# Patient Record
Sex: Male | Born: 1985
Health system: Southern US, Community
[De-identification: ages and names within clinical notes are randomized; demographics above are authoritative.]

---

## 2016-05-02 DIAGNOSIS — F900 Attention-deficit hyperactivity disorder, predominantly inattentive type: Secondary | ICD-10-CM | POA: Diagnosis not present

## 2016-10-24 DIAGNOSIS — F900 Attention-deficit hyperactivity disorder, predominantly inattentive type: Secondary | ICD-10-CM | POA: Diagnosis not present

## 2017-01-21 DIAGNOSIS — Z13220 Encounter for screening for lipoid disorders: Secondary | ICD-10-CM | POA: Diagnosis not present

## 2017-01-21 DIAGNOSIS — Z0000 Encounter for general adult medical examination without abnormal findings: Secondary | ICD-10-CM | POA: Diagnosis not present

## 2017-01-23 DIAGNOSIS — Z6838 Body mass index (BMI) 38.0-38.9, adult: Secondary | ICD-10-CM | POA: Diagnosis not present

## 2017-01-23 DIAGNOSIS — Z0000 Encounter for general adult medical examination without abnormal findings: Secondary | ICD-10-CM | POA: Diagnosis not present

## 2017-04-17 DIAGNOSIS — F900 Attention-deficit hyperactivity disorder, predominantly inattentive type: Secondary | ICD-10-CM | POA: Diagnosis not present

## 2017-09-02 DIAGNOSIS — R030 Elevated blood-pressure reading, without diagnosis of hypertension: Secondary | ICD-10-CM | POA: Diagnosis not present

## 2017-09-02 DIAGNOSIS — E663 Overweight: Secondary | ICD-10-CM | POA: Diagnosis not present

## 2017-09-02 DIAGNOSIS — Z6836 Body mass index (BMI) 36.0-36.9, adult: Secondary | ICD-10-CM | POA: Diagnosis not present

## 2017-09-02 DIAGNOSIS — J45909 Unspecified asthma, uncomplicated: Secondary | ICD-10-CM | POA: Diagnosis not present

## 2017-10-09 DIAGNOSIS — F900 Attention-deficit hyperactivity disorder, predominantly inattentive type: Secondary | ICD-10-CM | POA: Diagnosis not present

## 2018-04-02 DIAGNOSIS — F900 Attention-deficit hyperactivity disorder, predominantly inattentive type: Secondary | ICD-10-CM | POA: Diagnosis not present

## 2018-09-24 DIAGNOSIS — F900 Attention-deficit hyperactivity disorder, predominantly inattentive type: Secondary | ICD-10-CM | POA: Diagnosis not present

## 2019-08-22 ENCOUNTER — Emergency Department (HOSPITAL_COMMUNITY)
Admission: EM | Admit: 2019-08-22 | Discharge: 2019-08-22 | Disposition: A | Discharge status: Home / Self Care | Payer: BC Managed Care – PPO | Attending: Emergency Medicine | Admitting: Emergency Medicine

## 2019-08-22 ENCOUNTER — Emergency Department (HOSPITAL_COMMUNITY): Payer: BC Managed Care – PPO

## 2019-08-22 ENCOUNTER — Encounter (HOSPITAL_COMMUNITY): Payer: Self-pay | Admitting: *Deleted

## 2019-08-22 ENCOUNTER — Other Ambulatory Visit: Payer: Self-pay

## 2019-08-22 ENCOUNTER — Other Ambulatory Visit (HOSPITAL_COMMUNITY): Payer: BC Managed Care – PPO

## 2019-08-22 DIAGNOSIS — R1011 Right upper quadrant pain: Secondary | ICD-10-CM | POA: Diagnosis not present

## 2019-08-22 DIAGNOSIS — R1013 Epigastric pain: Secondary | ICD-10-CM | POA: Diagnosis not present

## 2019-08-22 DIAGNOSIS — Q600 Renal agenesis, unilateral: Secondary | ICD-10-CM | POA: Diagnosis not present

## 2019-08-22 DIAGNOSIS — R109 Unspecified abdominal pain: Secondary | ICD-10-CM

## 2019-08-22 LAB — COMPREHENSIVE METABOLIC PANEL
ALT: 33 U/L (ref 0–44)
AST: 24 U/L (ref 15–41)
Albumin: 4.5 g/dL (ref 3.5–5.0)
Alkaline Phosphatase: 85 U/L (ref 38–126)
Anion gap: 13 (ref 5–15)
BUN: 10 mg/dL (ref 6–20)
CO2: 22 mmol/L (ref 22–32)
Calcium: 9.8 mg/dL (ref 8.9–10.3)
Chloride: 104 mmol/L (ref 98–111)
Creatinine, Ser: 0.94 mg/dL (ref 0.61–1.24)
GFR calc Af Amer: >60 mL/min (ref >60)
GFR calc non Af Amer: >60 mL/min (ref >60)
Glucose, Bld: 126 mg/dL — ABNORMAL HIGH (ref 70–99)
Potassium: 4 mmol/L (ref 3.5–5.1)
Sodium: 139 mmol/L (ref 135–145)
Total Bilirubin: 0.6 mg/dL (ref 0.3–1.2)
Total Protein: 8 g/dL (ref 6.5–8.1)

## 2019-08-22 LAB — CBC
HCT: 52.8 % — ABNORMAL HIGH (ref 39.0–52.0)
Hemoglobin: 17.6 g/dL — ABNORMAL HIGH (ref 13.0–17.0)
MCH: 29.9 pg (ref 26.0–34.0)
MCHC: 33.3 g/dL (ref 30.0–36.0)
MCV: 89.8 fL (ref 80.0–100.0)
Platelets: 271 10*3/uL (ref 150–400)
RBC: 5.88 MIL/uL — ABNORMAL HIGH (ref 4.22–5.81)
RDW: 12.2 % (ref 11.5–15.5)
WBC: 17.8 10*3/uL — ABNORMAL HIGH (ref 4.0–10.5)
nRBC: 0 % (ref 0.0–0.2)

## 2019-08-22 LAB — URINALYSIS, ROUTINE W REFLEX MICROSCOPIC
Bilirubin Urine: NEGATIVE
Glucose, UA: NEGATIVE mg/dL
Hgb urine dipstick: NEGATIVE
Ketones, ur: 5 mg/dL — AB
Leukocytes,Ua: NEGATIVE
Nitrite: NEGATIVE
Protein, ur: NEGATIVE mg/dL
Specific Gravity, Urine: 1.038 — ABNORMAL HIGH (ref 1.005–1.030)
pH: 6 (ref 5.0–8.0)

## 2019-08-22 LAB — LIPASE, BLOOD: Lipase: 22 U/L (ref 11–51)

## 2019-08-22 LAB — TROPONIN I (HIGH SENSITIVITY): Troponin I (High Sensitivity): 3 ng/L (ref <18)

## 2019-08-22 MED ORDER — IOHEXOL 300 MG/ML  SOLN
100.0000 mL | Freq: Once | INTRAMUSCULAR | Status: AC | PRN
  Administered 2019-08-22: 100 mL via INTRAVENOUS

## 2019-08-22 MED ORDER — DICYCLOMINE HCL 10 MG PO CAPS
10.0000 mg | ORAL_CAPSULE | Freq: Once | ORAL | Status: AC
  Administered 2019-08-22: 10 mg via ORAL
  Filled 2019-08-22: qty 1

## 2019-08-22 MED ORDER — HYDROMORPHONE HCL 1 MG/ML IJ SOLN
1.0000 mg | Freq: Once | INTRAMUSCULAR | Status: AC
  Administered 2019-08-22: 1 mg via INTRAVENOUS
  Filled 2019-08-22: qty 1

## 2019-08-22 MED ORDER — LACTATED RINGERS IV BOLUS
1000.0000 mL | Freq: Once | INTRAVENOUS | Status: AC
  Administered 2019-08-22: 1000 mL via INTRAVENOUS

## 2019-08-22 MED ORDER — OMEPRAZOLE 20 MG PO CPDR: 20.0000 mg | DELAYED_RELEASE_CAPSULE | Freq: Every day | ORAL | 0 refills | Status: AC

## 2019-08-22 MED ORDER — SUCRALFATE 1 G PO TABS: 1.0000 g | ORAL_TABLET | Freq: Three times a day (TID) | ORAL | 0 refills | Status: AC

## 2019-08-22 MED ORDER — FENTANYL CITRATE (PF) 100 MCG/2ML IJ SOLN
50.0000 ug | Freq: Once | INTRAMUSCULAR | Status: AC
  Administered 2019-08-22: 50 ug via INTRAVENOUS
  Filled 2019-08-22 (×2): qty 2

## 2019-08-22 MED ORDER — ALUM & MAG HYDROXIDE-SIMETH 200-200-20 MG/5ML PO SUSP
30.0000 mL | Freq: Once | ORAL | Status: AC
  Administered 2019-08-22: 10:00:00 30 mL via ORAL
  Filled 2019-08-22: qty 30

## 2019-08-22 MED ORDER — LACTATED RINGERS IV BOLUS
1000.0000 mL | Freq: Once | INTRAVENOUS | Status: AC
  Administered 2019-08-22: 06:00:00 1000 mL via INTRAVENOUS

## 2019-08-22 MED ORDER — ONDANSETRON HCL 4 MG/2ML IJ SOLN
4.0000 mg | Freq: Once | INTRAMUSCULAR | Status: AC
  Administered 2019-08-22: 4 mg via INTRAVENOUS
  Filled 2019-08-22: qty 2

## 2019-08-22 MED ORDER — SODIUM CHLORIDE 0.9% FLUSH
3.0000 mL | Freq: Once | INTRAVENOUS | Status: AC
  Administered 2019-08-22: 3 mL via INTRAVENOUS

## 2019-08-22 MED ORDER — ONDANSETRON 4 MG PO TBDP
8.0000 mg | ORAL_TABLET | Freq: Once | ORAL | Status: AC
  Administered 2019-08-22: 8 mg via ORAL
  Filled 2019-08-22: qty 2

## 2019-08-22 NOTE — ED Notes (Signed)
Patient verbalizes understanding of discharge instructions. Opportunity for questioning and answers were provided. Armband removed by staff, pt discharged from ED via wheelchair to home.  

## 2019-08-22 NOTE — ED Triage Notes (Signed)
The pt has had epigastric pain radiating  Around rt laterally  For 12 hours  He had a piece of pizza around 1545 and the pain became worse  Then about 2 hours he was feeling better and he ate peanuts and drank orange juice  Which made the pain much worse vomiting on arrival sweating

## 2019-08-22 NOTE — ED Provider Notes (Signed)
Emergency Department Provider Note   I have reviewed the triage vital signs and the nursing notes.   HISTORY  Chief Complaint Abdominal Pain   HPI Rodney Macias is a 33 y.o. male who presents the emergency department today for decreased bowel movements and abdominal pain.  Patient states that last couple days a progressively worsening epigastric and right upper quadrant abdominal pain.  Decreased flatus as well.  Patient states that most recently got worse after eating pizza.  Did subside a little bit after a few hours but then he had another fatty snack and it came back.  He had some vomiting with it and dry heaving here.  No fevers.  No history of the same.  No history of abdominal surgeries.  No known cancers.  Patient was apparently diet reticulocyte in triage.   No other associated or modifying symptoms.    History reviewed. No pertinent past medical history.  There are no active problems to display for this patient.   History reviewed. No pertinent surgical history.  Current Outpatient Rx  . Order #: [id number] Class: Historical Med  . Order #: [id number] Class: Historical Med  . Order #: [id number] Class: Historical Med  . Order #: [id number] Class: Historical Med    Allergies Patient has no known allergies.  No family history on file.  Social History Social History   Tobacco Use  . Smoking status: Never Smoker  . Smokeless tobacco: Never Used  Substance Use Topics  . Alcohol use: Never    Frequency: Never  . Drug use: Never    Review of Systems  All other systems negative except as documented in the HPI. All pertinent positives and negatives as reviewed in the HPI. ____________________________________________   PHYSICAL EXAM:  VITAL SIGNS: ED Triage Vitals  Enc Vitals Group     BP 08/22/19 0434 (!) 165/112     Pulse Rate 08/22/19 0434 (!) 104     Resp 08/22/19 0434 20     Temp 08/22/19 0434 97.8 F (36.6 C)     Temp Source 08/22/19 0434 Oral   SpO2 08/22/19 0434 100 %     Weight 08/22/19 0522 240 lb (108.9 kg)     Height 08/22/19 0522 5\' 10"  (1.778 m)     Head Circumference --      Peak Flow --      Pain Score 08/22/19 0521 10     Pain Loc --      Pain Edu? --      Excl. in [place]? --     Constitutional: Alert and oriented. Well appearing but did have a couple episodes of dry heaving while I was in the room. Eyes: Conjunctivae are normal. PERRL. EOMI. Head: Atraumatic. Nose: No congestion/rhinnorhea. Mouth/Throat: Mucous membranes are dry.  Oropharynx non-erythematous. Neck: No stridor.  No meningeal signs.   Cardiovascular: Normal rate, regular rhythm. Good peripheral circulation. Grossly normal heart sounds.   Respiratory: Normal respiratory effort.  No retractions. Lungs CTAB. Gastrointestinal: Soft and tender in his right upper quadrant more so than epigastric and right lower quadrant.  Left side is normal.  Patient feels just tended and bloated does not have tense ascites or distention on my exam but hard to assess further secondary to body habitus.  Musculoskeletal: No lower extremity tenderness nor edema. No gross deformities of extremities. Neurologic:  Normal speech and language. No gross focal neurologic deficits are appreciated.  Skin:  Skin is warm, dry and intact. No rash noted.  ____________________________________________   LABS (all  labs ordered are listed, but only abnormal results are displayed)  Labs Reviewed  COMPREHENSIVE METABOLIC PANEL - Abnormal; Notable for the following components:      Result Value   Glucose, Bld 126 (*)    All other components within normal limits  CBC - Abnormal; Notable for the following components:   WBC 17.8 (*)    RBC 5.88 (*)    Hemoglobin 17.6 (*)    HCT 52.8 (*)    All other components within normal limits  LIPASE, BLOOD  URINALYSIS, ROUTINE W REFLEX MICROSCOPIC  TROPONIN I (HIGH SENSITIVITY)  TROPONIN I (HIGH SENSITIVITY)    ____________________________________________  EKG   EKG Interpretation  Date/Time:  Saturday August 22 2019 04:38:27 EDT Ventricular Rate:  94 PR Interval:  142 QRS Duration: 82 QT Interval:  336 QTC Calculation: 420 R Axis:   36 Text Interpretation:  Normal sinus rhythm with sinus arrhythmia Minimal voltage criteria for LVH, may be normal variant Borderline ECG No old tracing to compare Confirmed by Marily MemosMesner, Avinash Maltos [id number]) on 08/22/2019 6:29:04 AM       ____________________________________________  RADIOLOGY  No results found.  ____________________________________________   PROCEDURES  Procedure(s) performed:   Procedures   ____________________________________________   INITIAL IMPRESSION / ASSESSMENT AND PLAN / ED COURSE  Concern for obstruction versus cholecystitis versus pancreatitis versus possibly but less likely dissection.  Will evaluate with CT scan and labs.  Medications ordered for symptoms at this time.  Care transferred pending ct scan and reval.      Pertinent labs & imaging results that were available during my care of the patient were reviewed by me and considered in my medical decision making (see chart for details). ____________________________________________  FINAL CLINICAL IMPRESSION(S) / ED DIAGNOSES  Final diagnoses:  None     MEDICATIONS GIVEN DURING THIS VISIT:  Medications  fentaNYL (SUBLIMAZE) injection 50 mcg (50 mcg Intravenous Not Given 08/22/19 0549)  lactated ringers bolus 1,000 mL (has no administration in time range)  sodium chloride flush (NS) 0.9 % injection 3 mL (3 mLs Intravenous Given 08/22/19 0543)  ondansetron (ZOFRAN-ODT) disintegrating tablet 8 mg (8 mg Oral Given 08/22/19 0448)  lactated ringers bolus 1,000 mL (1,000 mLs Intravenous New Bag/Given 08/22/19 0609)  HYDROmorphone (DILAUDID) injection 1 mg (1 mg Intravenous Given 08/22/19 0556)  ondansetron (ZOFRAN) injection 4 mg (4 mg Intravenous Given 08/22/19 0554)      NEW OUTPATIENT MEDICATIONS STARTED DURING THIS VISIT:  New Prescriptions   No medications on file    Note:  This note was prepared with assistance of Dragon voice recognition software. Occasional wrong-word or sound-a-like substitutions may have occurred due to the inherent limitations of voice recognition software.   Tashan Kreitzer, Barbara CowerJason, MD 08/23/19 [phone]

## 2019-08-22 NOTE — ED Notes (Signed)
Patient transported to Ultrasound 

## 2019-08-22 NOTE — ED Provider Notes (Signed)
I received this patient in signout from Dr. Dayna Barker.  Briefly, he had presented with epigastric abdominal pain that had worsened after eating pizza.  At time of signout, his lab work was notable only for WBC 17, normal LFTs.  Awaiting CT scan.  CT shows no acute findings to explain the patient's symptoms.  I did obtain a right upper quadrant ultrasound which showed a normal gallbladder.  Patient comfortable on reassessment. I discussed starting PPI and carafate and following low acid diet. Instructed to f/u with PCP.  I have extensively reviewed return precautions with him and he voiced understanding.   Raeshawn Vo, Wenda Overland, MD 08/22/19 1159

## 2019-08-22 NOTE — ED Notes (Signed)
Pt returns from [facility]. 

## 2019-08-22 NOTE — ED Notes (Signed)
NO BM FOR 2 DAYS

## 2019-08-22 NOTE — ED Notes (Signed)
Pt requesting additional pain meds.

## 2019-08-24 DIAGNOSIS — R1013 Epigastric pain: Secondary | ICD-10-CM | POA: Diagnosis not present

## 2019-08-27 DIAGNOSIS — Z1159 Encounter for screening for other viral diseases: Secondary | ICD-10-CM | POA: Diagnosis not present

## 2019-09-01 DIAGNOSIS — R112 Nausea with vomiting, unspecified: Secondary | ICD-10-CM | POA: Diagnosis not present

## 2019-09-01 DIAGNOSIS — K2930 Chronic superficial gastritis without bleeding: Secondary | ICD-10-CM | POA: Diagnosis not present

## 2019-09-01 DIAGNOSIS — R1013 Epigastric pain: Secondary | ICD-10-CM | POA: Diagnosis not present

## 2019-10-09 DIAGNOSIS — F900 Attention-deficit hyperactivity disorder, predominantly inattentive type: Secondary | ICD-10-CM | POA: Diagnosis not present

## 2019-11-27 DIAGNOSIS — Z021 Encounter for pre-employment examination: Secondary | ICD-10-CM | POA: Diagnosis not present

## 2020-04-01 DIAGNOSIS — F900 Attention-deficit hyperactivity disorder, predominantly inattentive type: Secondary | ICD-10-CM | POA: Diagnosis not present

## 2020-08-08 ENCOUNTER — Emergency Department (HOSPITAL_COMMUNITY): Payer: BC Managed Care – PPO

## 2020-08-08 ENCOUNTER — Other Ambulatory Visit: Payer: Self-pay

## 2020-08-08 ENCOUNTER — Emergency Department (HOSPITAL_COMMUNITY)
Admission: EM | Admit: 2020-08-08 | Discharge: 2020-08-08 | Disposition: A | Discharge status: Home / Self Care | Payer: BC Managed Care – PPO | Attending: Emergency Medicine | Admitting: Emergency Medicine

## 2020-08-08 ENCOUNTER — Encounter (HOSPITAL_COMMUNITY): Payer: Self-pay

## 2020-08-08 DIAGNOSIS — R059 Cough, unspecified: Secondary | ICD-10-CM

## 2020-08-08 DIAGNOSIS — U071 COVID-19: Secondary | ICD-10-CM | POA: Diagnosis not present

## 2020-08-08 DIAGNOSIS — R000 Tachycardia, unspecified: Secondary | ICD-10-CM | POA: Diagnosis not present

## 2020-08-08 DIAGNOSIS — R05 Cough: Secondary | ICD-10-CM | POA: Diagnosis not present

## 2020-08-08 DIAGNOSIS — Z79899 Other long term (current) drug therapy: Secondary | ICD-10-CM | POA: Diagnosis not present

## 2020-08-08 LAB — SARS CORONAVIRUS 2 BY RT PCR (HOSPITAL ORDER, PERFORMED IN ~~LOC~~ HOSPITAL LAB): SARS Coronavirus 2: POSITIVE — AB

## 2020-08-08 MED ORDER — METHYLPREDNISOLONE SODIUM SUCC 125 MG IJ SOLR: 125.0000 mg | Freq: Once | INTRAMUSCULAR | Status: DC | PRN

## 2020-08-08 MED ORDER — SODIUM CHLORIDE 0.9 % IV SOLN: INTRAVENOUS | Status: DC | PRN

## 2020-08-08 MED ORDER — DIPHENHYDRAMINE HCL 50 MG/ML IJ SOLN: 50.0000 mg | Freq: Once | INTRAMUSCULAR | Status: DC | PRN

## 2020-08-08 MED ORDER — ACETAMINOPHEN 500 MG PO TABS: 1000.0000 mg | ORAL_TABLET | Freq: Once | ORAL | Status: DC

## 2020-08-08 MED ORDER — SODIUM CHLORIDE 0.9 % IV SOLN
1200.0000 mg | Freq: Once | INTRAVENOUS | Status: AC
  Administered 2020-08-08: 1200 mg via INTRAVENOUS
  Filled 2020-08-08: qty 10

## 2020-08-08 MED ORDER — FAMOTIDINE IN NACL 20-0.9 MG/50ML-% IV SOLN: 20.0000 mg | Freq: Once | INTRAVENOUS | Status: DC | PRN

## 2020-08-08 MED ORDER — ALBUTEROL SULFATE HFA 108 (90 BASE) MCG/ACT IN AERS: 2.0000 | INHALATION_SPRAY | Freq: Once | RESPIRATORY_TRACT | Status: DC | PRN

## 2020-08-08 MED ORDER — ACETAMINOPHEN 325 MG PO TABS
325.0000 mg | ORAL_TABLET | Freq: Once | ORAL | Status: AC
  Administered 2020-08-08: 325 mg via ORAL
  Filled 2020-08-08: qty 1

## 2020-08-08 MED ORDER — EPINEPHRINE 0.3 MG/0.3ML IJ SOAJ: 0.3000 mg | Freq: Once | INTRAMUSCULAR | Status: DC | PRN

## 2020-08-08 MED ORDER — ACETAMINOPHEN 325 MG PO TABS
650.0000 mg | ORAL_TABLET | Freq: Once | ORAL | Status: AC | PRN
  Administered 2020-08-08: 650 mg via ORAL
  Filled 2020-08-08: qty 2

## 2020-08-08 NOTE — ED Provider Notes (Signed)
[facility] DEPT Provider Note   CSN: [id number] Arrival date & time: 08/08/20  1224     History Chief Complaint  Patient presents with  . Shortness of Breath  . Cough  . Fever    Rodney Macias is a 34 y.o. male.  Patient is a 34 year old male with no significant past medical history.  He presents with complaints of fever, cough, and shortness of breath.  This has worsened over the past 3 days.  Patient reports being had a kit together with other individuals who have since been diagnosed with COVID-19.  He denies any aggravating or alleviating factors.  Patient has not received either dose of Covid vaccine.  The history is provided by the patient.       History reviewed. No pertinent past medical history.  There are no problems to display for this patient.   History reviewed. No pertinent surgical history.     History reviewed. No pertinent family history.  Social History   Tobacco Use  . Smoking status: Never Smoker  . Smokeless tobacco: Never Used  Substance Use Topics  . Alcohol use: Never  . Drug use: Never    Home Medications Prior to Admission medications   Medication Sig Start Date End Date Taking? Authorizing Provider  amphetamine-dextroamphetamine (ADDERALL XR) 30 MG 24 hr capsule Take 30 mg by mouth daily as needed (when working).   Yes [provider]  amphetamine-dextroamphetamine (ADDERALL) 20 MG tablet Take 20 mg by mouth daily as needed (when working).   Yes [provider]  omeprazole (PRILOSEC) 20 MG capsule Take 1 capsule (20 mg total) by mouth daily. Patient not taking: Reported on 08/08/2020 08/22/19   Little, Wenda Overland, MD  sucralfate (CARAFATE) 1 g tablet Take 1 tablet (1 g total) by mouth 4 (four) times daily -  with meals and at bedtime for 7 days. Patient not taking: Reported on 08/08/2020 08/22/19 08/29/19  Little, Wenda Overland, MD    Allergies    Patient has no known  allergies.  Review of Systems   Review of Systems  All other systems reviewed and are negative.   Physical Exam Updated Vital Signs BP 134/87   Pulse 73   Temp 98.9 F (37.2 C)   Resp 20   Ht 5' 10"  (1.778 m)   Wt 117.9 kg   SpO2 94%   BMI 37.31 kg/m   Physical Exam Vitals and nursing note reviewed.  Constitutional:      General: He is not in acute distress.    Appearance: He is well-developed. He is not diaphoretic.  HENT:     Head: Normocephalic and atraumatic.  Cardiovascular:     Rate and Rhythm: Normal rate and regular rhythm.     Heart sounds: No murmur heard.  No friction rub.  Pulmonary:     Effort: Pulmonary effort is normal. No respiratory distress.     Breath sounds: Normal breath sounds. No wheezing or rales.  Abdominal:     General: Bowel sounds are normal. There is no distension.     Palpations: Abdomen is soft.     Tenderness: There is no abdominal tenderness.  Musculoskeletal:        General: Normal range of motion.     Cervical back: Normal range of motion and neck supple.     Right lower leg: No tenderness. No edema.     Left lower leg: No tenderness. No edema.  Skin:    General: Skin is  warm and dry.  Neurological:     Mental Status: He is alert and oriented to person, place, and time.     Coordination: Coordination normal.     ED Results / Procedures / Treatments   Labs (all labs ordered are listed, but only abnormal results are displayed) Labs Reviewed  SARS CORONAVIRUS 2 BY RT PCR (HOSPITAL ORDER, [facility]) - Abnormal; Notable for the following components:      Result Value   SARS Coronavirus 2 POSITIVE (*)    All other components within normal limits    EKG None  Radiology DG Chest 2 View  Result Date: 08/08/2020 CLINICAL DATA:  Cough, recent COVID exposure EXAM: CHEST - 2 VIEW COMPARISON:  None. FINDINGS: The heart size and mediastinal contours are within normal limits. Both lungs are clear. No  pleural effusion. The visualized skeletal structures are unremarkable. IMPRESSION: No acute process in the chest. Electronically Signed   By: Macy Mis M.D.   On: 08/08/2020 09:50    Procedures Procedures (including critical care time)  Medications Ordered in ED Medications  0.9 %  sodium chloride infusion (has no administration in time range)  diphenhydrAMINE (BENADRYL) injection 50 mg (has no administration in time range)  famotidine (PEPCID) IVPB 20 mg premix (has no administration in time range)  methylPREDNISolone sodium succinate (SOLU-MEDROL) 125 mg/2 mL injection 125 mg (has no administration in time range)  albuterol (VENTOLIN HFA) 108 (90 Base) MCG/ACT inhaler 2 puff (has no administration in time range)  EPINEPHrine (EPI-PEN) injection 0.3 mg (has no administration in time range)  acetaminophen (TYLENOL) tablet 650 mg (650 mg Oral Given 08/08/20 1027)  casirivimab-imdevimab (REGEN-COV) 1,200 mg in sodium chloride 0.9 % 110 mL IVPB (0 mg Intravenous Stopped 08/08/20 1302)  acetaminophen (TYLENOL) tablet 325 mg (325 mg Oral Given 08/08/20 1118)    ED Course  I have reviewed the triage vital signs and the nursing notes.  Pertinent labs & imaging results that were available during my care of the patient were reviewed by me and considered in my medical decision making (see chart for details).    MDM Rules/Calculators/A&P  Patient is a 34 year old male presenting with fever and upper respiratory symptoms.  His chest x-ray is clear, but Covid test is positive.  Patient has history of asthma and BMI of 37.  He is a candidate for monoclonal antibody infusion and is receptive to this.  Patient given the infusion and will be discharged.  There is no hypoxia and his vitals are otherwise stable.  He is nontoxic-appearing.  Patient advised to isolate at home.  Final Clinical Impression(s) / ED Diagnoses Final diagnoses:  Cough    Rx / DC Orders ED Discharge Orders    None        Veryl Speak, MD 08/08/20 1438

## 2020-08-08 NOTE — ED Triage Notes (Signed)
Patient has been around people with Covid on Saturday. Difficulty breathing, cough and fever x 2 days.

## 2020-08-08 NOTE — Discharge Instructions (Addendum)
Take Tylenol 1000 mg rotated with ibuprofen 600 mg every 4 hours as needed for fever.  Drink plenty of fluids and get plenty of rest.  Return to the emergency department if you develop severe chest pain, worsening breathing, or other new and concerning symptoms.      Person Under Monitoring Name: Rodney Macias  Location: [address] [place] [address]   Infection Prevention Recommendations for Individuals Confirmed to have, or Being Evaluated for, 2019 Novel Coronavirus (COVID-19) Infection Who Receive Care at Home  Individuals who are confirmed to have, or are being evaluated for, COVID-19 should follow the prevention steps below until a healthcare provider or local or state health department says they can return to normal activities.  Stay home except to get medical care You should restrict activities outside your home, except for getting medical care. Do not go to work, school, or public areas, and do not use public transportation or taxis.  Call ahead before visiting your doctor Before your medical appointment, call the healthcare provider and tell them that you have, or are being evaluated for, COVID-19 infection. This will help the healthcare provider's office take steps to keep other people from getting infected. Ask your healthcare provider to call the local or state health department.  Monitor your symptoms Seek prompt medical attention if your illness is worsening (e.g., difficulty breathing). Before going to your medical appointment, call the healthcare provider and tell them that you have, or are being evaluated for, COVID-19 infection. Ask your healthcare provider to call the local or state health department.  Wear a facemask You should wear a facemask that covers your nose and mouth when you are in the same room with other people and when you visit a healthcare provider. People who live with or visit you should also wear a facemask while they are in  the same room with you.  Separate yourself from other people in your home As much as possible, you should stay in a different room from other people in your home. Also, you should use a separate bathroom, if available.  Avoid sharing household items You should not share dishes, drinking glasses, cups, eating utensils, towels, bedding, or other items with other people in your home. After using these items, you should wash them thoroughly with soap and water.  Cover your coughs and sneezes Cover your mouth and nose with a tissue when you cough or sneeze, or you can cough or sneeze into your sleeve. Throw used tissues in a lined trash can, and immediately wash your hands with soap and water for at least 20 seconds or use an alcohol-based hand rub.  Wash your [employer] your hands often and thoroughly with soap and water for at least 20 seconds. You can use an alcohol-based hand sanitizer if soap and water are not available and if your hands are not visibly dirty. Avoid touching your eyes, nose, and mouth with unwashed hands.   Prevention Steps for Caregivers and Household Members of Individuals Confirmed to have, or Being Evaluated for, COVID-19 Infection Being Cared for in the Home  If you live with, or provide care at home for, a person confirmed to have, or being evaluated for, COVID-19 infection please follow these guidelines to prevent infection:  Follow healthcare provider's instructions Make sure that you understand and can help the patient follow any healthcare provider instructions for all care.  Provide for the patient's basic needs You should help the patient with basic needs in the  home and provide support for getting groceries, prescriptions, and other personal needs.  Monitor the patient's symptoms If they are getting sicker, call his or her medical provider and tell them that the patient has, or is being evaluated for, COVID-19 infection. This will help the healthcare  provider's office take steps to keep other people from getting infected. Ask the healthcare provider to call the local or state health department.  Limit the number of people who have contact with the patient If possible, have only one caregiver for the patient. Other household members should stay in another home or place of residence. If this is not possible, they should stay in another room, or be separated from the patient as much as possible. Use a separate bathroom, if available. Restrict visitors who do not have an essential need to be in the home.  Keep older adults, very young children, and other sick people away from the patient Keep older adults, very young children, and those who have compromised immune systems or chronic health conditions away from the patient. This includes people with chronic heart, lung, or kidney conditions, diabetes, and cancer.  Ensure good ventilation Make sure that shared spaces in the home have good air flow, such as from an air conditioner or an opened window, weather permitting.  Wash your hands often Wash your hands often and thoroughly with soap and water for at least 20 seconds. You can use an alcohol based hand sanitizer if soap and water are not available and if your hands are not visibly dirty. Avoid touching your eyes, nose, and mouth with unwashed hands. Use disposable paper towels to dry your hands. If not available, use dedicated cloth towels and replace them when they become wet.  Wear a facemask and gloves Wear a disposable facemask at all times in the room and gloves when you touch or have contact with the patient's blood, body fluids, and/or secretions or excretions, such as sweat, saliva, sputum, nasal mucus, vomit, urine, or feces.  Ensure the mask fits over your nose and mouth tightly, and do not touch it during use. Throw out disposable facemasks and gloves after using them. Do not reuse. Wash your hands immediately after removing your  facemask and gloves. If your personal clothing becomes contaminated, carefully remove clothing and launder. Wash your hands after handling contaminated clothing. Place all used disposable facemasks, gloves, and other waste in a lined container before disposing them with other household waste. Remove gloves and wash your hands immediately after handling these items.  Do not share dishes, glasses, or other household items with the patient Avoid sharing household items. You should not share dishes, drinking glasses, cups, eating utensils, towels, bedding, or other items with a patient who is confirmed to have, or being evaluated for, COVID-19 infection. After the person uses these items, you should wash them thoroughly with soap and water.  Wash laundry thoroughly Immediately remove and wash clothes or bedding that have blood, body fluids, and/or secretions or excretions, such as sweat, saliva, sputum, nasal mucus, vomit, urine, or feces, on them. Wear gloves when handling laundry from the patient. Read and follow directions on labels of laundry or clothing items and detergent. In general, wash and dry with the warmest temperatures recommended on the label.  Clean all areas the individual has used often Clean all touchable surfaces, such as counters, tabletops, doorknobs, bathroom fixtures, toilets, phones, keyboards, tablets, and bedside tables, every day. Also, clean any surfaces that may have blood, body fluids, and/or  secretions or excretions on them. Wear gloves when cleaning surfaces the patient has come in contact with. Use a diluted bleach solution (e.g., dilute bleach with 1 part bleach and 10 parts water) or a household disinfectant with a label that says EPA-registered for coronaviruses. To make a bleach solution at home, add 1 tablespoon of bleach to 1 quart (4 cups) of water. For a larger supply, add  cup of bleach to 1 gallon (16 cups) of water. Read labels of cleaning products and  follow recommendations provided on product labels. Labels contain instructions for safe and effective use of the cleaning product including precautions you should take when applying the product, such as wearing gloves or eye protection and making sure you have good ventilation during use of the product. Remove gloves and wash hands immediately after cleaning.  Monitor yourself for signs and symptoms of illness Caregivers and household members are considered close contacts, should monitor their health, and will be asked to limit movement outside of the home to the extent possible. Follow the monitoring steps for close contacts listed on the symptom monitoring form.   ? If you have additional questions, contact your local health department or call the epidemiologist on call at [phone] (available 24/7). ? This guidance is subject to change. For the most up-to-date guidance from [facility], please refer to their website: [email or web address]

## 2020-08-09 ENCOUNTER — Telehealth: Payer: Self-pay | Admitting: Oncology

## 2020-08-09 DIAGNOSIS — J45909 Unspecified asthma, uncomplicated: Secondary | ICD-10-CM | POA: Diagnosis not present

## 2020-08-09 DIAGNOSIS — J309 Allergic rhinitis, unspecified: Secondary | ICD-10-CM | POA: Diagnosis not present

## 2020-08-09 DIAGNOSIS — J069 Acute upper respiratory infection, unspecified: Secondary | ICD-10-CM | POA: Diagnosis not present

## 2020-08-09 DIAGNOSIS — U071 COVID-19: Secondary | ICD-10-CM | POA: Diagnosis not present

## 2020-08-17 DIAGNOSIS — J45909 Unspecified asthma, uncomplicated: Secondary | ICD-10-CM | POA: Diagnosis not present

## 2020-08-17 DIAGNOSIS — E669 Obesity, unspecified: Secondary | ICD-10-CM | POA: Diagnosis not present

## 2020-08-17 DIAGNOSIS — J40 Bronchitis, not specified as acute or chronic: Secondary | ICD-10-CM | POA: Diagnosis not present

## 2020-08-17 DIAGNOSIS — U071 COVID-19: Secondary | ICD-10-CM | POA: Diagnosis not present

## 2020-08-22 DIAGNOSIS — Z23 Encounter for immunization: Secondary | ICD-10-CM | POA: Diagnosis not present

## 2020-08-22 IMAGING — CT CT ABD-PELV W/ CM
2 of 4 series · 16 of 46 positions shown, 18 images · IV contrast (APPLIED)
Comparison: No priors.

CLINICAL DATA: 33-year-old male with history of abdominal
distension, abdominal pain and nausea and vomiting.

EXAM:
CT ABDOMEN AND PELVIS WITH CONTRAST
TECHNIQUE: Multidetector CT imaging of the abdomen and pelvis was performed
using the standard protocol following bolus administration of
intravenous contrast.
CONTRAST:  100mL OMNIPAQUE IOHEXOL 300 MG/ML  SOLN

[Series 3: abdomen 5.0 · axial · 0.91mm/px · z∈[+787,+1237]mm · 13 of 104 slices shown, 15 images]
[im 7/104  soft-tissue]
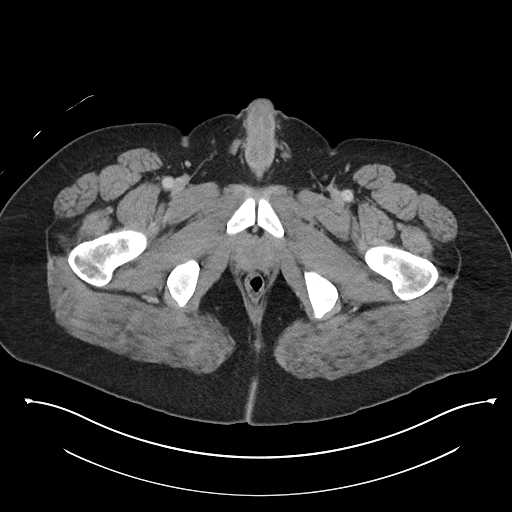
[im 7/104  bone]
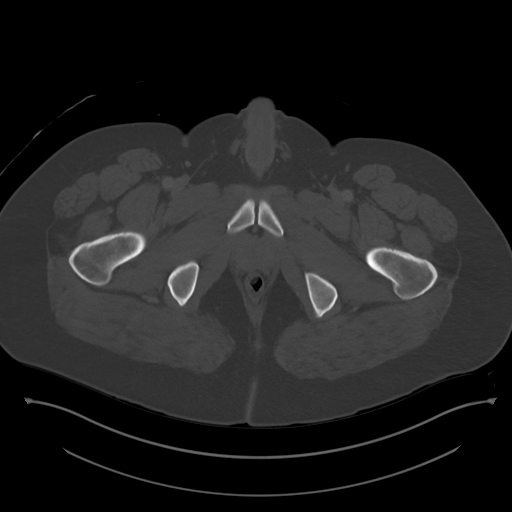
[im 13/104  soft-tissue]
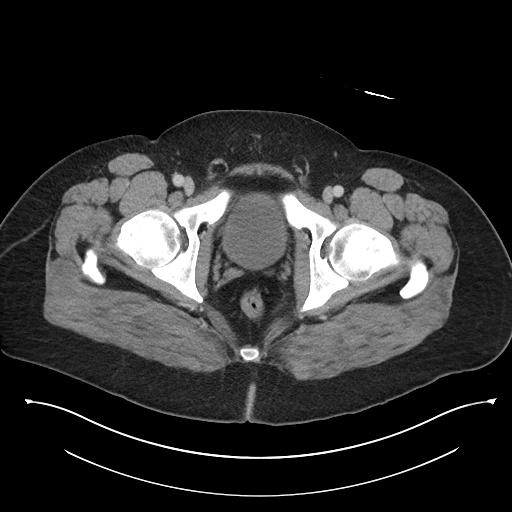
[im 25/104  soft-tissue]
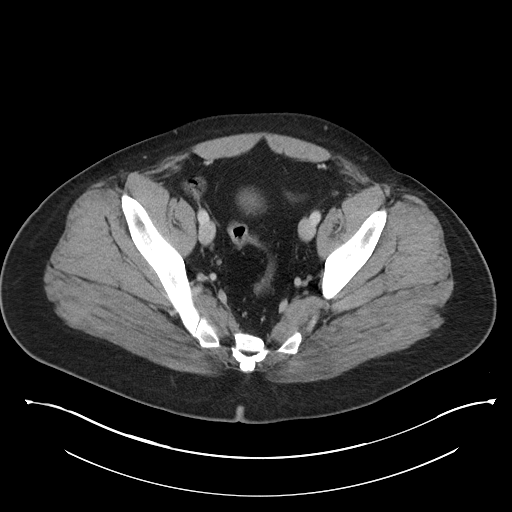
[im 31/104  soft-tissue]
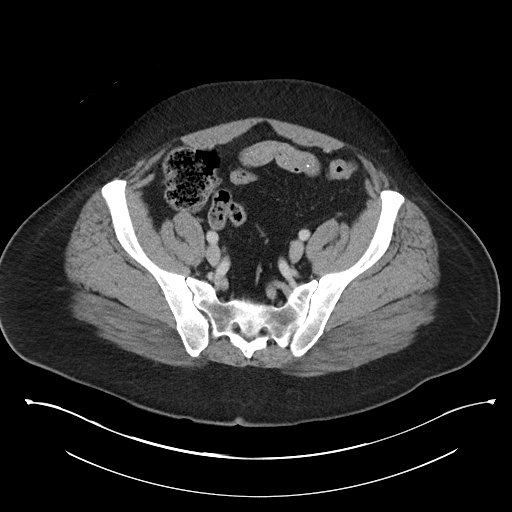
[im 37/104  soft-tissue]
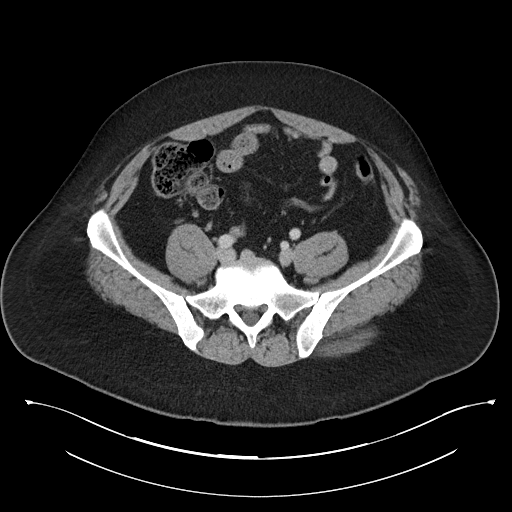
[im 43/104  soft-tissue]
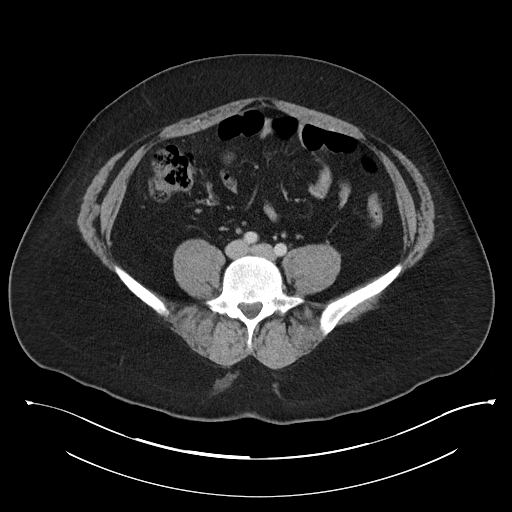
[im 55/104  soft-tissue]
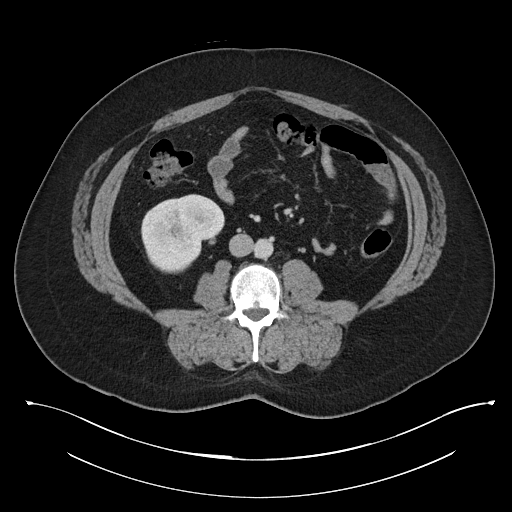
[im 61/104  soft-tissue]
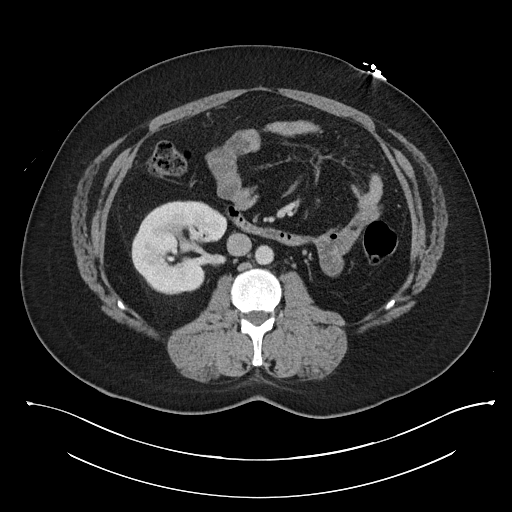
[im 67/104  soft-tissue]
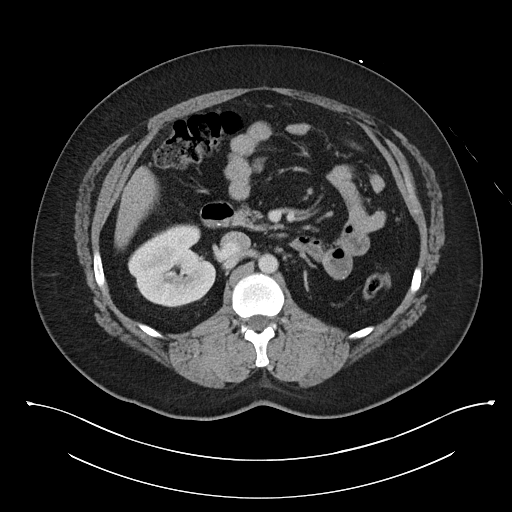
[im 67/104  bone]
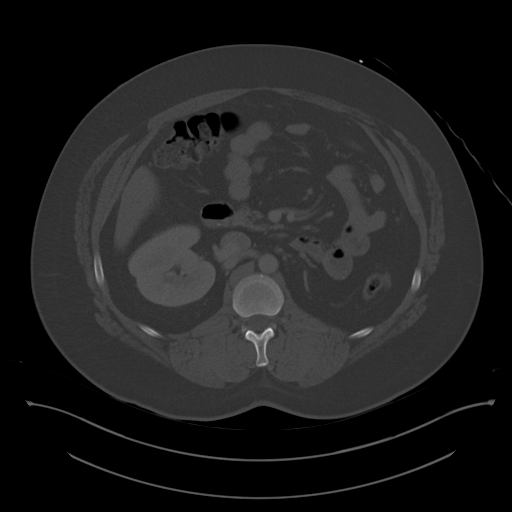
[im 73/104  soft-tissue]
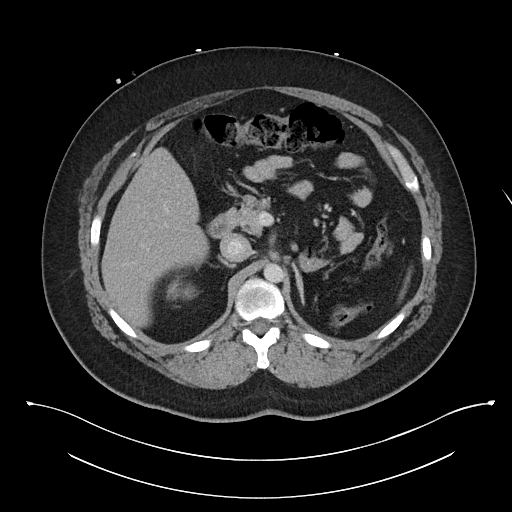
[im 79/104  soft-tissue]
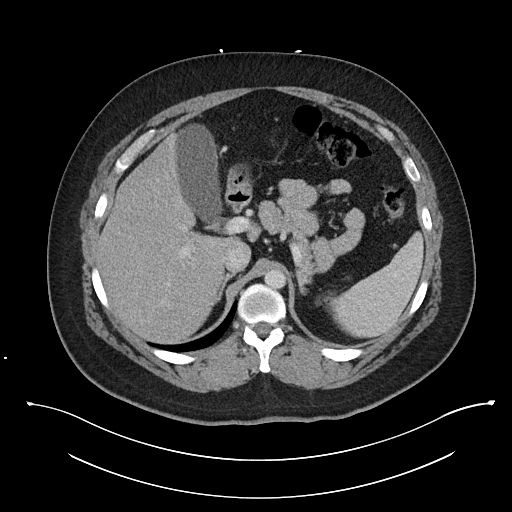
[im 91/104  soft-tissue]
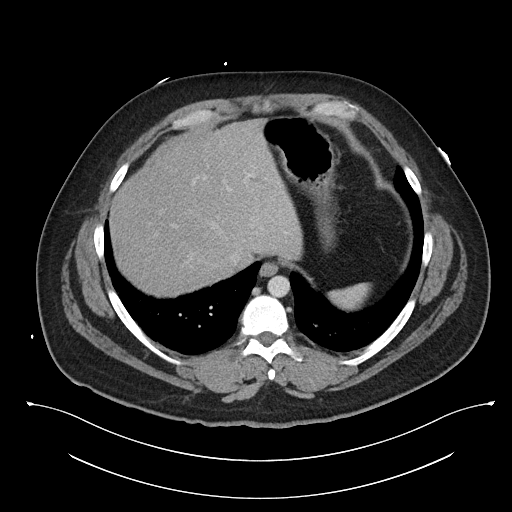
[im 97/104  soft-tissue]
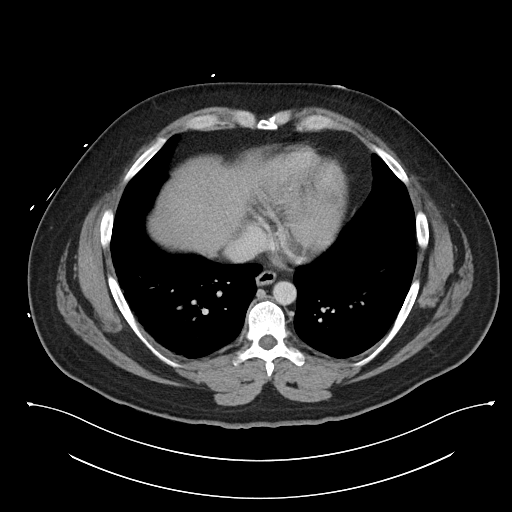

[Series 6: abdomen 3.0 mpr cor · coronal · 0.94mm/px · 3 of 118 slices shown]
[im 40/118  soft-tissue]
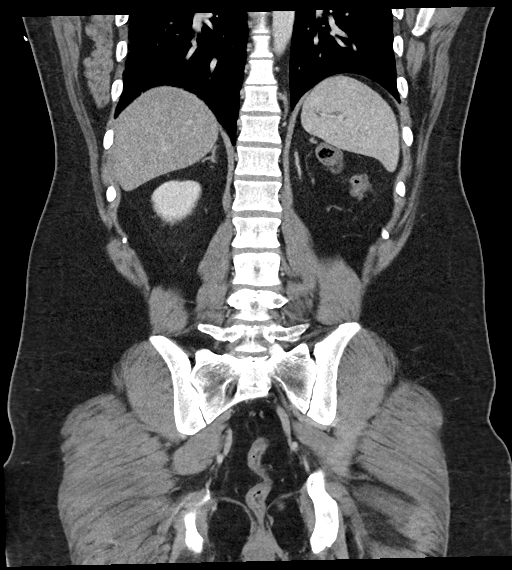
[im 53/118  soft-tissue]
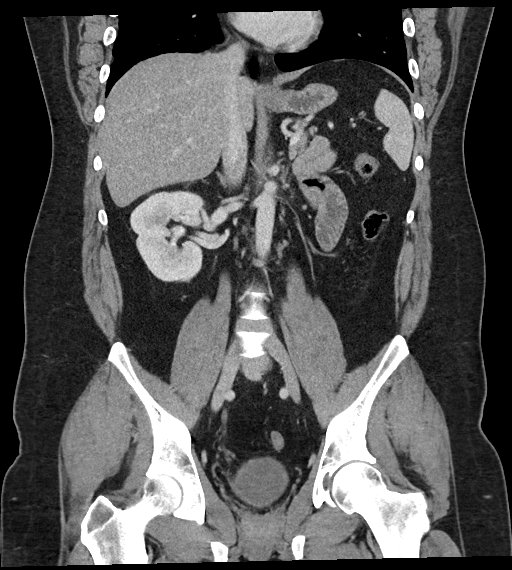
[im 66/118  soft-tissue]
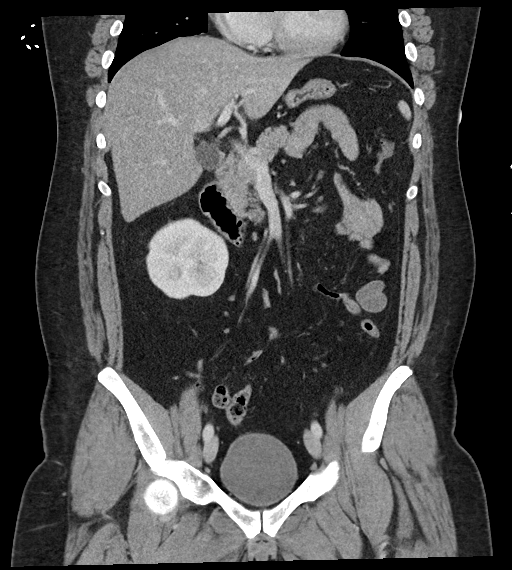

[16 of 46 positions shown; findings below may reference images not displayed]

FINDINGS: Lower chest: Unremarkable.

Hepatobiliary: No suspicious cystic or solid hepatic lesions. No
intra or extrahepatic biliary ductal dilatation. Gallbladder is
normal in appearance.

Pancreas: No pancreatic mass. No pancreatic ductal dilatation. No
pancreatic or peripancreatic fluid collections or inflammatory
changes.

Spleen: Unremarkable.

Adrenals/Urinary Tract: Right kidney and bilateral adrenal glands
are normal in appearance. Left kidney is not visualized, either
congenitally or surgically absent, or severely atrophic. No
hydroureteronephrosis. Urinary bladder is normal in appearance.

Stomach/Bowel: Normal appearance of the stomach. No pathologic
dilatation of small bowel or colon. Normal appendix.

Vascular/Lymphatic: No significant atherosclerotic disease, aneurysm
or dissection noted in the abdominal or pelvic vasculature. No
lymphadenopathy noted in the abdomen or pelvis.

Reproductive: Prostate gland and seminal vesicles are unremarkable
in appearance.

Other: No significant volume of ascites.  No pneumoperitoneum.

Musculoskeletal: There are no aggressive appearing lytic or blastic
lesions noted in the visualized portions of the skeleton.
IMPRESSION: 1. No acute findings in the abdomen or pelvis to account for the
patient's symptoms.
2. Normal appendix.
3. Solitary right kidney.

## 2020-09-16 DIAGNOSIS — Z23 Encounter for immunization: Secondary | ICD-10-CM | POA: Diagnosis not present

## 2020-10-17 DIAGNOSIS — J4540 Moderate persistent asthma, uncomplicated: Secondary | ICD-10-CM | POA: Diagnosis not present

## 2020-10-17 DIAGNOSIS — J309 Allergic rhinitis, unspecified: Secondary | ICD-10-CM | POA: Diagnosis not present

## 2020-10-17 DIAGNOSIS — E663 Overweight: Secondary | ICD-10-CM | POA: Diagnosis not present

## 2020-10-18 DIAGNOSIS — F900 Attention-deficit hyperactivity disorder, predominantly inattentive type: Secondary | ICD-10-CM | POA: Diagnosis not present

## 2020-10-21 DIAGNOSIS — E785 Hyperlipidemia, unspecified: Secondary | ICD-10-CM | POA: Diagnosis not present

## 2020-10-21 DIAGNOSIS — Z0000 Encounter for general adult medical examination without abnormal findings: Secondary | ICD-10-CM | POA: Diagnosis not present

## 2020-10-25 DIAGNOSIS — Z0000 Encounter for general adult medical examination without abnormal findings: Secondary | ICD-10-CM | POA: Diagnosis not present

## 2020-10-25 DIAGNOSIS — Z23 Encounter for immunization: Secondary | ICD-10-CM | POA: Diagnosis not present

## 2020-11-14 DIAGNOSIS — I10 Essential (primary) hypertension: Secondary | ICD-10-CM | POA: Diagnosis not present

## 2020-11-14 DIAGNOSIS — E785 Hyperlipidemia, unspecified: Secondary | ICD-10-CM | POA: Diagnosis not present

## 2020-11-14 DIAGNOSIS — E669 Obesity, unspecified: Secondary | ICD-10-CM | POA: Diagnosis not present

## 2020-11-14 DIAGNOSIS — Z008 Encounter for other general examination: Secondary | ICD-10-CM | POA: Diagnosis not present

## 2020-11-24 DIAGNOSIS — I10 Essential (primary) hypertension: Secondary | ICD-10-CM | POA: Diagnosis not present

## 2020-12-27 DIAGNOSIS — I10 Essential (primary) hypertension: Secondary | ICD-10-CM | POA: Diagnosis not present

## 2020-12-27 DIAGNOSIS — M62838 Other muscle spasm: Secondary | ICD-10-CM | POA: Diagnosis not present

## 2020-12-27 DIAGNOSIS — E669 Obesity, unspecified: Secondary | ICD-10-CM | POA: Diagnosis not present

## 2021-01-26 DIAGNOSIS — I10 Essential (primary) hypertension: Secondary | ICD-10-CM | POA: Diagnosis not present

## 2021-01-26 DIAGNOSIS — E669 Obesity, unspecified: Secondary | ICD-10-CM | POA: Diagnosis not present

## 2021-01-26 DIAGNOSIS — E785 Hyperlipidemia, unspecified: Secondary | ICD-10-CM | POA: Diagnosis not present

## 2021-02-23 DIAGNOSIS — E785 Hyperlipidemia, unspecified: Secondary | ICD-10-CM | POA: Diagnosis not present

## 2021-02-23 DIAGNOSIS — I10 Essential (primary) hypertension: Secondary | ICD-10-CM | POA: Diagnosis not present

## 2021-02-23 DIAGNOSIS — E669 Obesity, unspecified: Secondary | ICD-10-CM | POA: Diagnosis not present

## 2021-04-17 DIAGNOSIS — F900 Attention-deficit hyperactivity disorder, predominantly inattentive type: Secondary | ICD-10-CM | POA: Diagnosis not present

## 2021-07-28 NOTE — Telephone Encounter (Signed)
error 

## 2021-08-09 IMAGING — CR DG CHEST 2V
2 series · 2 of 2 positions shown · non-contrast
Comparison: None.

CLINICAL DATA: Cough, recent COVID exposure

EXAM:
CHEST - 2 VIEW

[w chest pa]
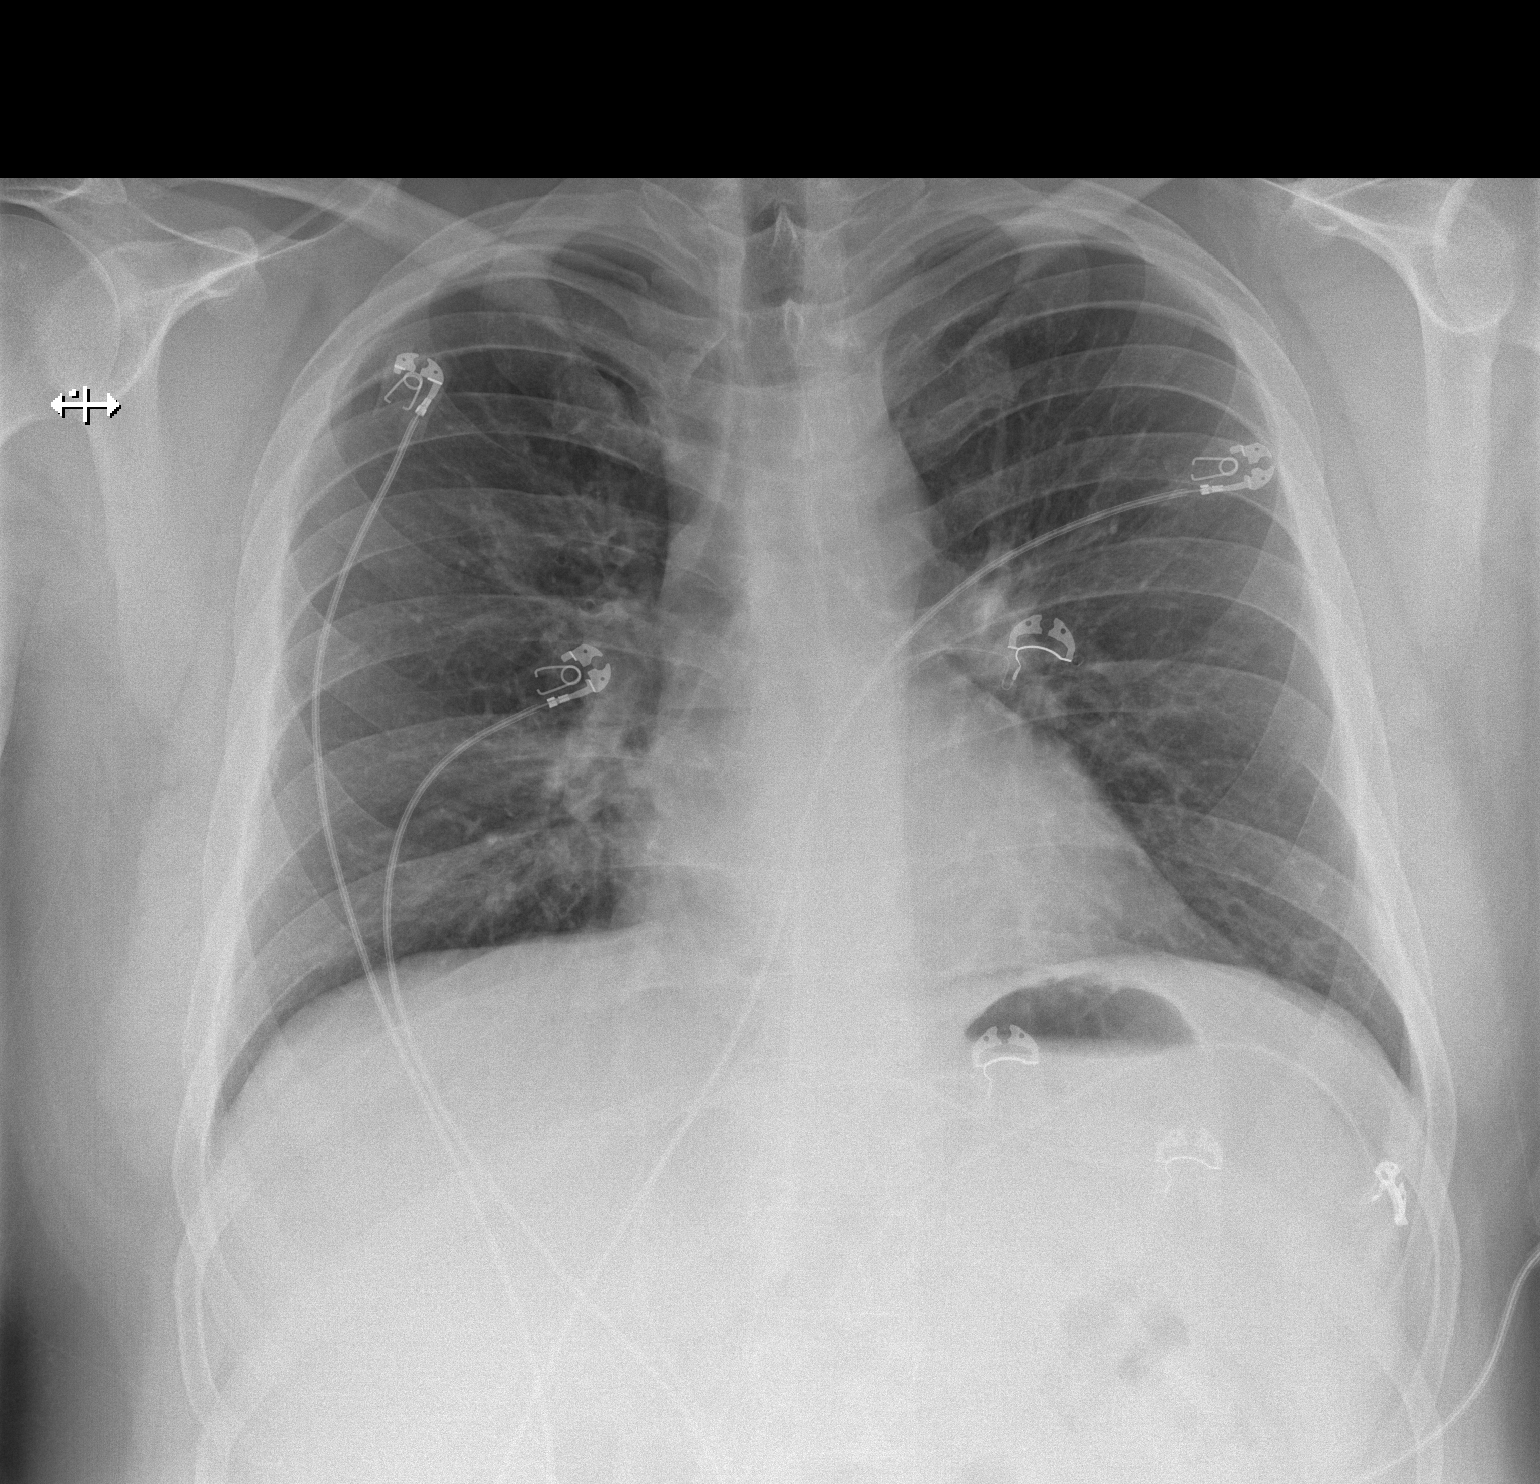

[w chest lat]
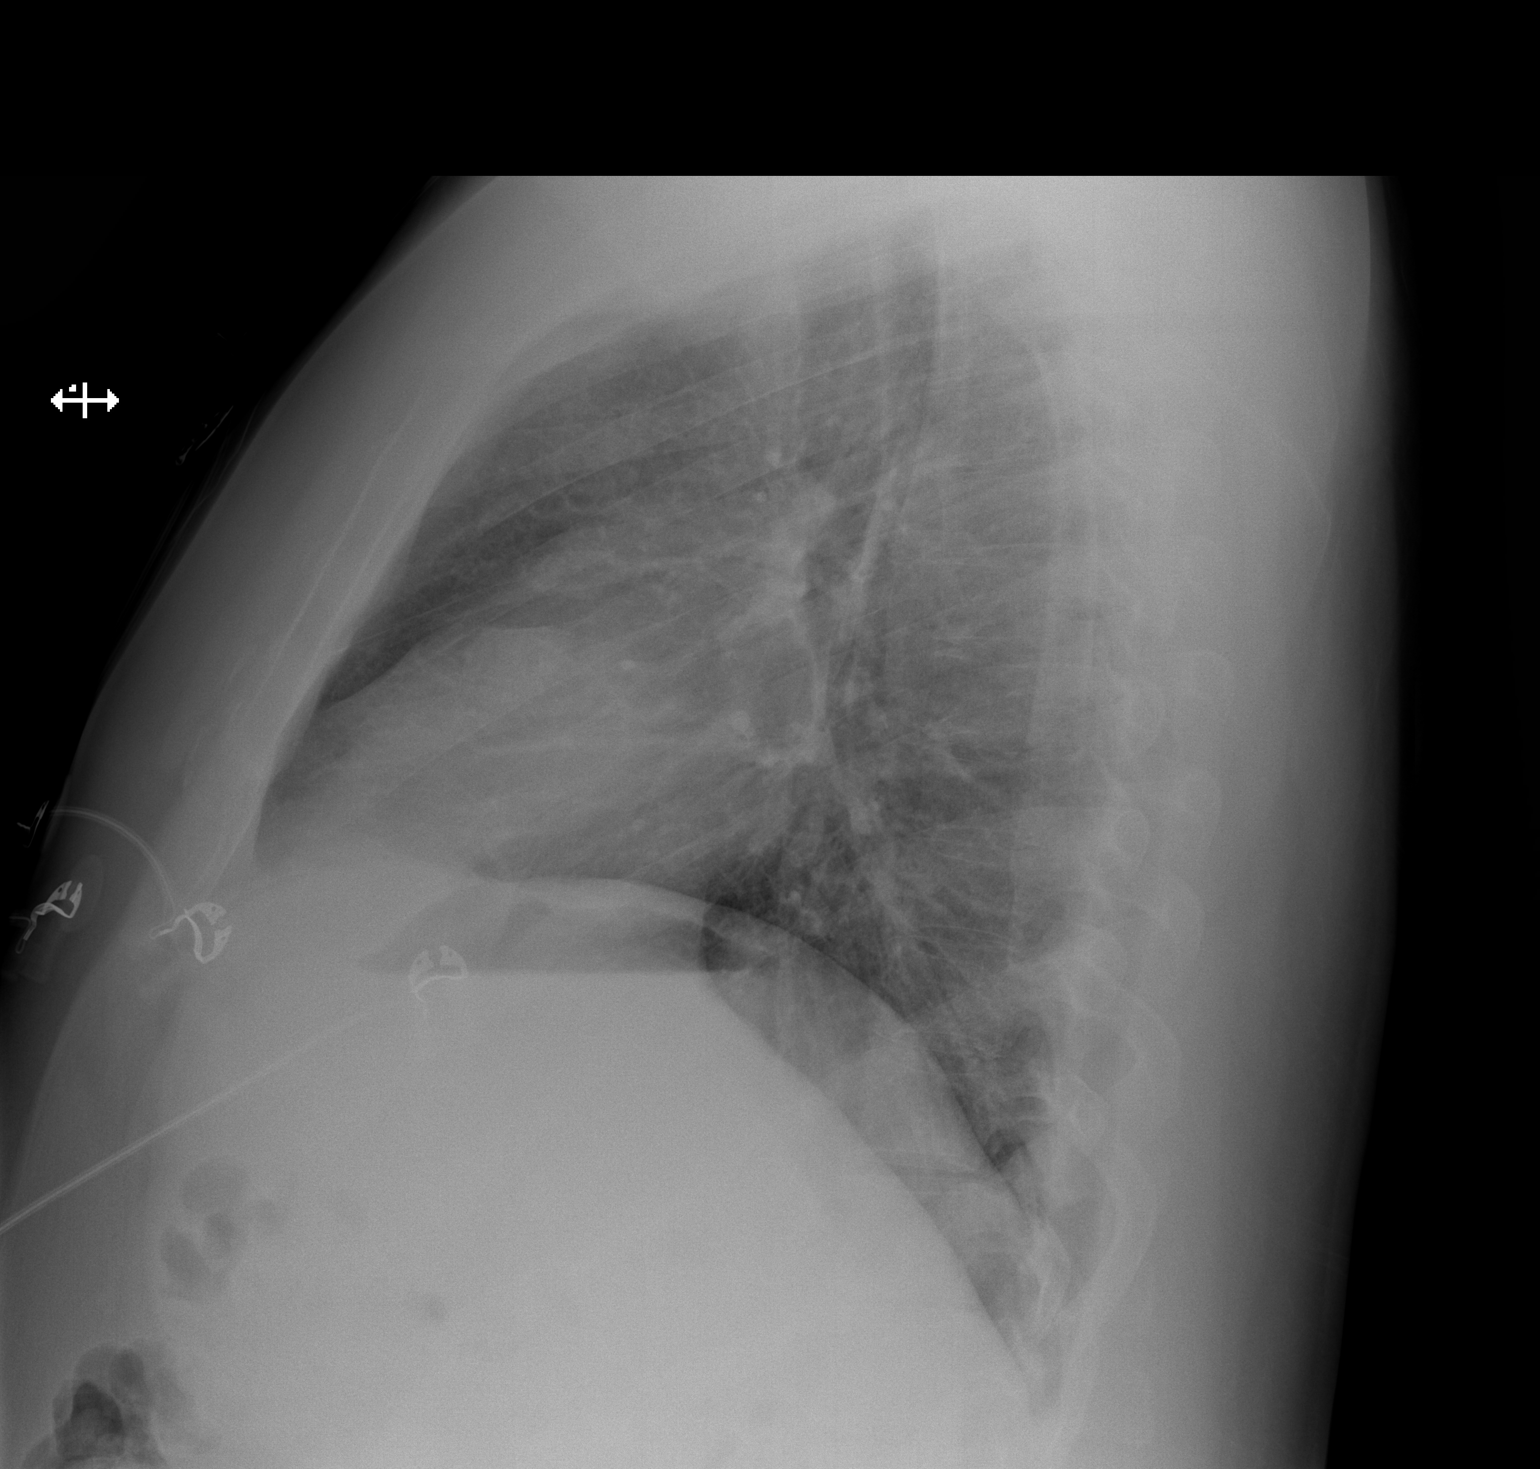

[2 of 2 positions shown; findings below may reference images not displayed]

FINDINGS: The heart size and mediastinal contours are within normal limits.
Both lungs are clear. No pleural effusion. The visualized skeletal
structures are unremarkable.
IMPRESSION: No acute process in the chest.

## 2021-11-06 DIAGNOSIS — F900 Attention-deficit hyperactivity disorder, predominantly inattentive type: Secondary | ICD-10-CM | POA: Diagnosis not present

## 2021-11-10 DIAGNOSIS — Z0001 Encounter for general adult medical examination with abnormal findings: Secondary | ICD-10-CM | POA: Diagnosis not present

## 2021-11-11 DIAGNOSIS — Z0001 Encounter for general adult medical examination with abnormal findings: Secondary | ICD-10-CM | POA: Diagnosis not present

## 2021-12-06 DIAGNOSIS — Z0000 Encounter for general adult medical examination without abnormal findings: Secondary | ICD-10-CM | POA: Diagnosis not present

## 2021-12-07 DIAGNOSIS — Z0000 Encounter for general adult medical examination without abnormal findings: Secondary | ICD-10-CM | POA: Diagnosis not present

## 2021-12-07 DIAGNOSIS — E785 Hyperlipidemia, unspecified: Secondary | ICD-10-CM | POA: Diagnosis not present

## 2021-12-07 DIAGNOSIS — J069 Acute upper respiratory infection, unspecified: Secondary | ICD-10-CM | POA: Diagnosis not present

## 2021-12-07 DIAGNOSIS — J45909 Unspecified asthma, uncomplicated: Secondary | ICD-10-CM | POA: Diagnosis not present

## 2021-12-07 DIAGNOSIS — I10 Essential (primary) hypertension: Secondary | ICD-10-CM | POA: Diagnosis not present

## 2021-12-14 DIAGNOSIS — Z23 Encounter for immunization: Secondary | ICD-10-CM | POA: Diagnosis not present

## 2022-01-15 DIAGNOSIS — Z23 Encounter for immunization: Secondary | ICD-10-CM | POA: Diagnosis not present

## 2022-02-13 DIAGNOSIS — I10 Essential (primary) hypertension: Secondary | ICD-10-CM | POA: Diagnosis not present

## 2022-02-13 DIAGNOSIS — J45909 Unspecified asthma, uncomplicated: Secondary | ICD-10-CM | POA: Diagnosis not present

## 2022-02-13 DIAGNOSIS — J309 Allergic rhinitis, unspecified: Secondary | ICD-10-CM | POA: Diagnosis not present

## 2022-02-13 DIAGNOSIS — F909 Attention-deficit hyperactivity disorder, unspecified type: Secondary | ICD-10-CM | POA: Diagnosis not present

## 2022-04-30 DIAGNOSIS — F900 Attention-deficit hyperactivity disorder, predominantly inattentive type: Secondary | ICD-10-CM | POA: Diagnosis not present

## 2022-06-28 DIAGNOSIS — Z23 Encounter for immunization: Secondary | ICD-10-CM | POA: Diagnosis not present

## 2022-08-15 DIAGNOSIS — J45909 Unspecified asthma, uncomplicated: Secondary | ICD-10-CM | POA: Diagnosis not present

## 2022-08-15 DIAGNOSIS — E669 Obesity, unspecified: Secondary | ICD-10-CM | POA: Diagnosis not present

## 2022-08-15 DIAGNOSIS — I10 Essential (primary) hypertension: Secondary | ICD-10-CM | POA: Diagnosis not present

## 2022-08-15 DIAGNOSIS — G4700 Insomnia, unspecified: Secondary | ICD-10-CM | POA: Diagnosis not present

## 2022-08-17 DIAGNOSIS — Z23 Encounter for immunization: Secondary | ICD-10-CM | POA: Diagnosis not present

## 2022-09-07 DIAGNOSIS — E669 Obesity, unspecified: Secondary | ICD-10-CM | POA: Diagnosis not present

## 2022-09-07 DIAGNOSIS — I10 Essential (primary) hypertension: Secondary | ICD-10-CM | POA: Diagnosis not present

## 2022-09-14 DIAGNOSIS — Z23 Encounter for immunization: Secondary | ICD-10-CM | POA: Diagnosis not present

## 2022-10-24 DIAGNOSIS — F900 Attention-deficit hyperactivity disorder, predominantly inattentive type: Secondary | ICD-10-CM | POA: Diagnosis not present

## 2022-10-31 DIAGNOSIS — Z713 Dietary counseling and surveillance: Secondary | ICD-10-CM | POA: Diagnosis not present

## 2022-10-31 DIAGNOSIS — E669 Obesity, unspecified: Secondary | ICD-10-CM | POA: Diagnosis not present

## 2022-10-31 DIAGNOSIS — I10 Essential (primary) hypertension: Secondary | ICD-10-CM | POA: Diagnosis not present

## 2022-11-20 DIAGNOSIS — Z0000 Encounter for general adult medical examination without abnormal findings: Secondary | ICD-10-CM | POA: Diagnosis not present

## 2022-11-20 DIAGNOSIS — Z026 Encounter for examination for insurance purposes: Secondary | ICD-10-CM | POA: Diagnosis not present

## 2022-12-11 DIAGNOSIS — Z0000 Encounter for general adult medical examination without abnormal findings: Secondary | ICD-10-CM | POA: Diagnosis not present

## 2023-02-07 ENCOUNTER — Other Ambulatory Visit (HOSPITAL_BASED_OUTPATIENT_CLINIC_OR_DEPARTMENT_OTHER): Payer: Self-pay

## 2023-02-07 ENCOUNTER — Other Ambulatory Visit: Payer: Self-pay

## 2023-02-07 DIAGNOSIS — Z713 Dietary counseling and surveillance: Secondary | ICD-10-CM | POA: Diagnosis not present

## 2023-02-07 DIAGNOSIS — E785 Hyperlipidemia, unspecified: Secondary | ICD-10-CM | POA: Diagnosis not present

## 2023-02-07 MED ORDER — WEGOVY 0.25 MG/0.5ML ~~LOC~~ SOAJ
0.2500 mg | SUBCUTANEOUS | 3 refills | Status: AC
  Filled 2023-02-07: qty 2, 28d supply, fill #0

## 2023-02-14 ENCOUNTER — Other Ambulatory Visit (HOSPITAL_BASED_OUTPATIENT_CLINIC_OR_DEPARTMENT_OTHER): Payer: Self-pay

## 2023-03-13 DIAGNOSIS — I10 Essential (primary) hypertension: Secondary | ICD-10-CM | POA: Diagnosis not present

## 2023-03-13 DIAGNOSIS — E782 Mixed hyperlipidemia: Secondary | ICD-10-CM | POA: Diagnosis not present

## 2023-03-21 ENCOUNTER — Other Ambulatory Visit (HOSPITAL_BASED_OUTPATIENT_CLINIC_OR_DEPARTMENT_OTHER): Payer: Self-pay

## 2023-03-21 DIAGNOSIS — E782 Mixed hyperlipidemia: Secondary | ICD-10-CM | POA: Diagnosis not present

## 2023-03-21 DIAGNOSIS — I10 Essential (primary) hypertension: Secondary | ICD-10-CM | POA: Diagnosis not present

## 2023-03-21 MED ORDER — WEGOVY 0.5 MG/0.5ML ~~LOC~~ SOAJ: SUBCUTANEOUS | 3 refills | Status: AC

## 2023-04-17 DIAGNOSIS — F900 Attention-deficit hyperactivity disorder, predominantly inattentive type: Secondary | ICD-10-CM | POA: Diagnosis not present

## 2023-10-16 DIAGNOSIS — F900 Attention-deficit hyperactivity disorder, predominantly inattentive type: Secondary | ICD-10-CM | POA: Diagnosis not present

## 2023-11-22 DIAGNOSIS — Z0000 Encounter for general adult medical examination without abnormal findings: Secondary | ICD-10-CM | POA: Diagnosis not present

## 2023-12-09 DIAGNOSIS — Z13220 Encounter for screening for lipoid disorders: Secondary | ICD-10-CM | POA: Diagnosis not present

## 2023-12-09 DIAGNOSIS — Z0000 Encounter for general adult medical examination without abnormal findings: Secondary | ICD-10-CM | POA: Diagnosis not present

## 2023-12-09 DIAGNOSIS — Z114 Encounter for screening for human immunodeficiency virus [HIV]: Secondary | ICD-10-CM | POA: Diagnosis not present

## 2023-12-19 DIAGNOSIS — J069 Acute upper respiratory infection, unspecified: Secondary | ICD-10-CM | POA: Diagnosis not present

## 2023-12-19 DIAGNOSIS — H6121 Impacted cerumen, right ear: Secondary | ICD-10-CM | POA: Diagnosis not present

## 2023-12-19 DIAGNOSIS — H6691 Otitis media, unspecified, right ear: Secondary | ICD-10-CM | POA: Diagnosis not present

## 2023-12-19 DIAGNOSIS — Z0000 Encounter for general adult medical examination without abnormal findings: Secondary | ICD-10-CM | POA: Diagnosis not present

## 2023-12-19 DIAGNOSIS — B9689 Other specified bacterial agents as the cause of diseases classified elsewhere: Secondary | ICD-10-CM | POA: Diagnosis not present

## 2023-12-19 DIAGNOSIS — J329 Chronic sinusitis, unspecified: Secondary | ICD-10-CM | POA: Diagnosis not present

## 2023-12-19 DIAGNOSIS — Z1159 Encounter for screening for other viral diseases: Secondary | ICD-10-CM | POA: Diagnosis not present

## 2024-04-08 DIAGNOSIS — F900 Attention-deficit hyperactivity disorder, predominantly inattentive type: Secondary | ICD-10-CM | POA: Diagnosis not present

## 2024-04-13 DIAGNOSIS — I10 Essential (primary) hypertension: Secondary | ICD-10-CM | POA: Diagnosis not present

## 2024-04-13 DIAGNOSIS — E785 Hyperlipidemia, unspecified: Secondary | ICD-10-CM | POA: Diagnosis not present

## 2024-04-16 DIAGNOSIS — I10 Essential (primary) hypertension: Secondary | ICD-10-CM | POA: Diagnosis not present

## 2024-04-16 DIAGNOSIS — J45909 Unspecified asthma, uncomplicated: Secondary | ICD-10-CM | POA: Diagnosis not present

## 2024-04-16 DIAGNOSIS — E782 Mixed hyperlipidemia: Secondary | ICD-10-CM | POA: Diagnosis not present

## 2024-04-16 DIAGNOSIS — Z789 Other specified health status: Secondary | ICD-10-CM | POA: Diagnosis not present

## 2024-10-02 DIAGNOSIS — F900 Attention-deficit hyperactivity disorder, predominantly inattentive type: Secondary | ICD-10-CM | POA: Diagnosis not present
